# Patient Record
Sex: Male | Born: 1980 | Race: White | Hispanic: No | Smoking: Never smoker
Health system: Southern US, Community
[De-identification: ages and names within clinical notes are randomized; demographics above are authoritative.]

## PROBLEM LIST (undated history)

## (undated) DIAGNOSIS — Z789 Other specified health status: Secondary | ICD-10-CM

## (undated) HISTORY — DX: Other specified health status: Z78.9

## (undated) HISTORY — PX: NO PAST SURGERIES: SHX2092

---

## 2009-12-09 ENCOUNTER — Ambulatory Visit (HOSPITAL_COMMUNITY): Admission: RE | Admit: 2009-12-09 | Discharge: 2009-12-09 | Payer: Self-pay | Admitting: Dermatology

## 2011-03-03 IMAGING — CR DG CHEST 2V
3 series · 3 of 3 positions shown · non-contrast
Comparison: None

CLINICAL DATA: Rash.

CHEST - 2 VIEW

[w chest pa]
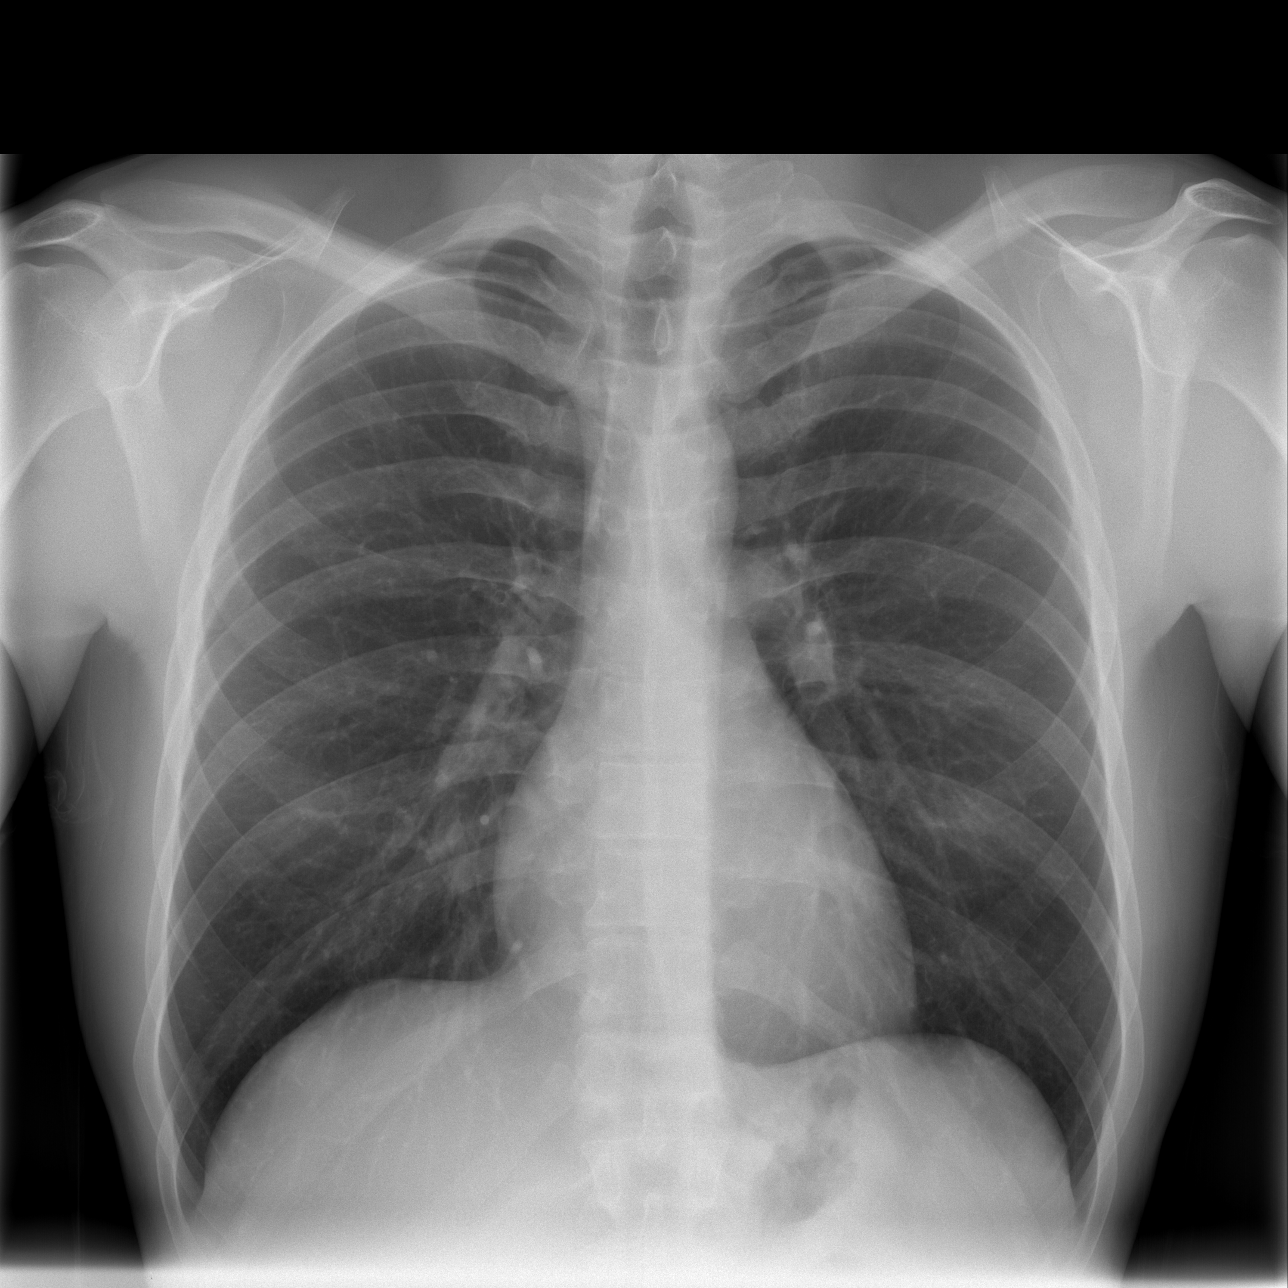

[w chest lat (1 of 2)]
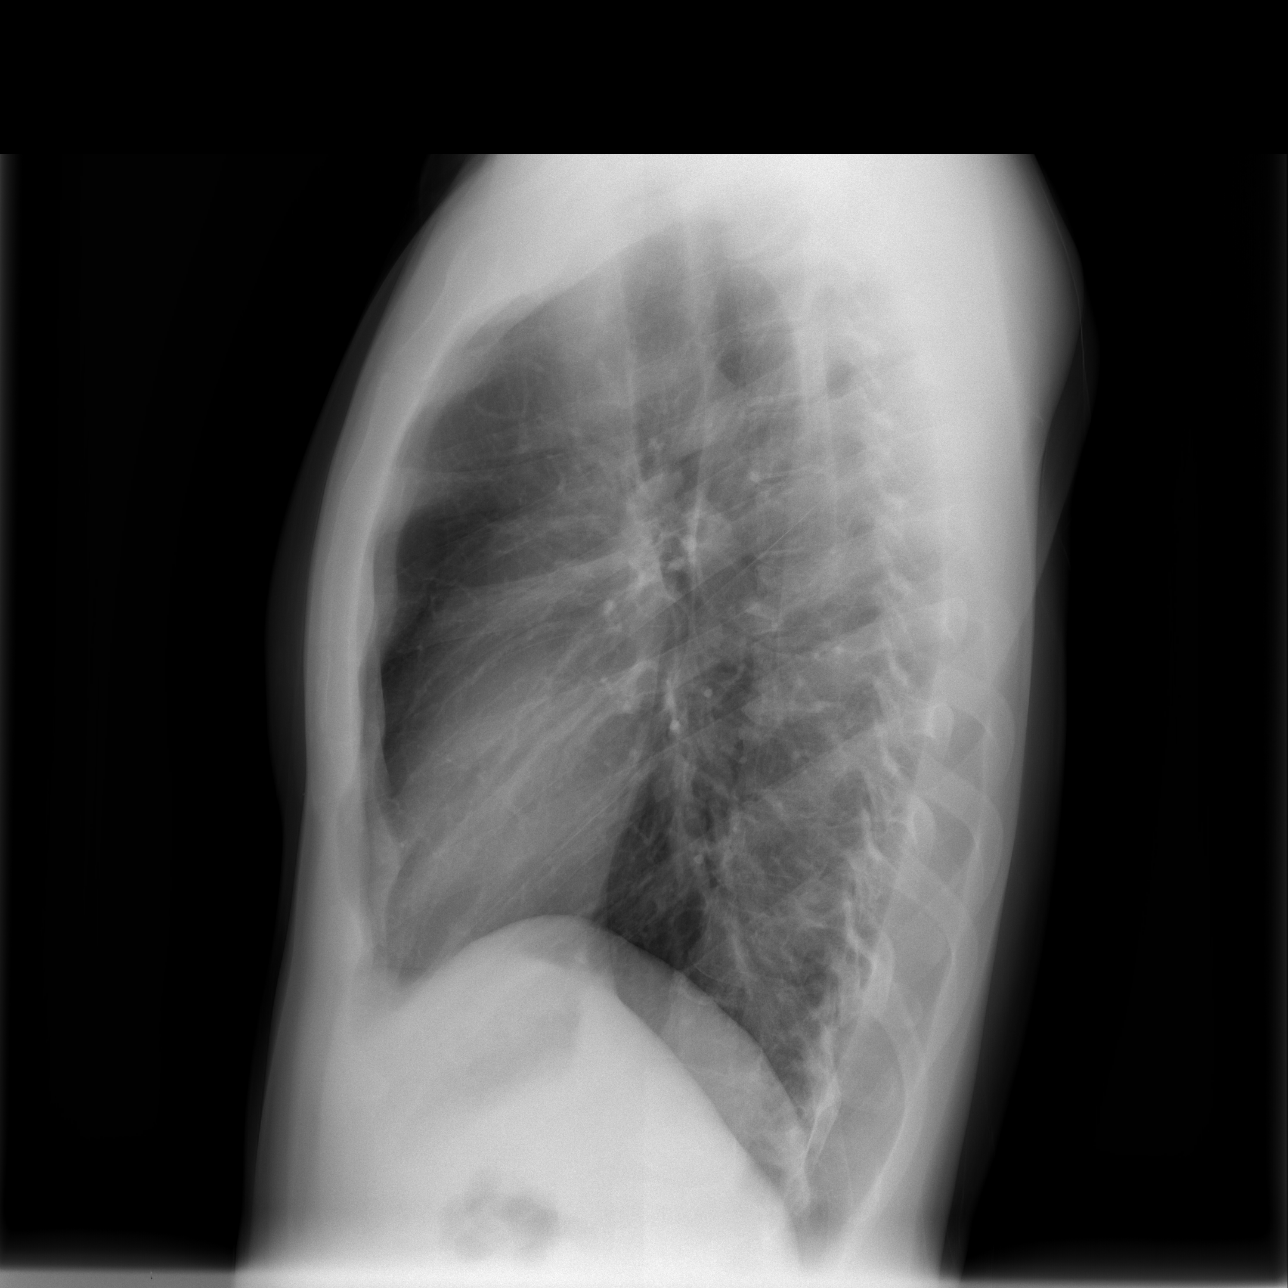

[w chest lat (2 of 2)]
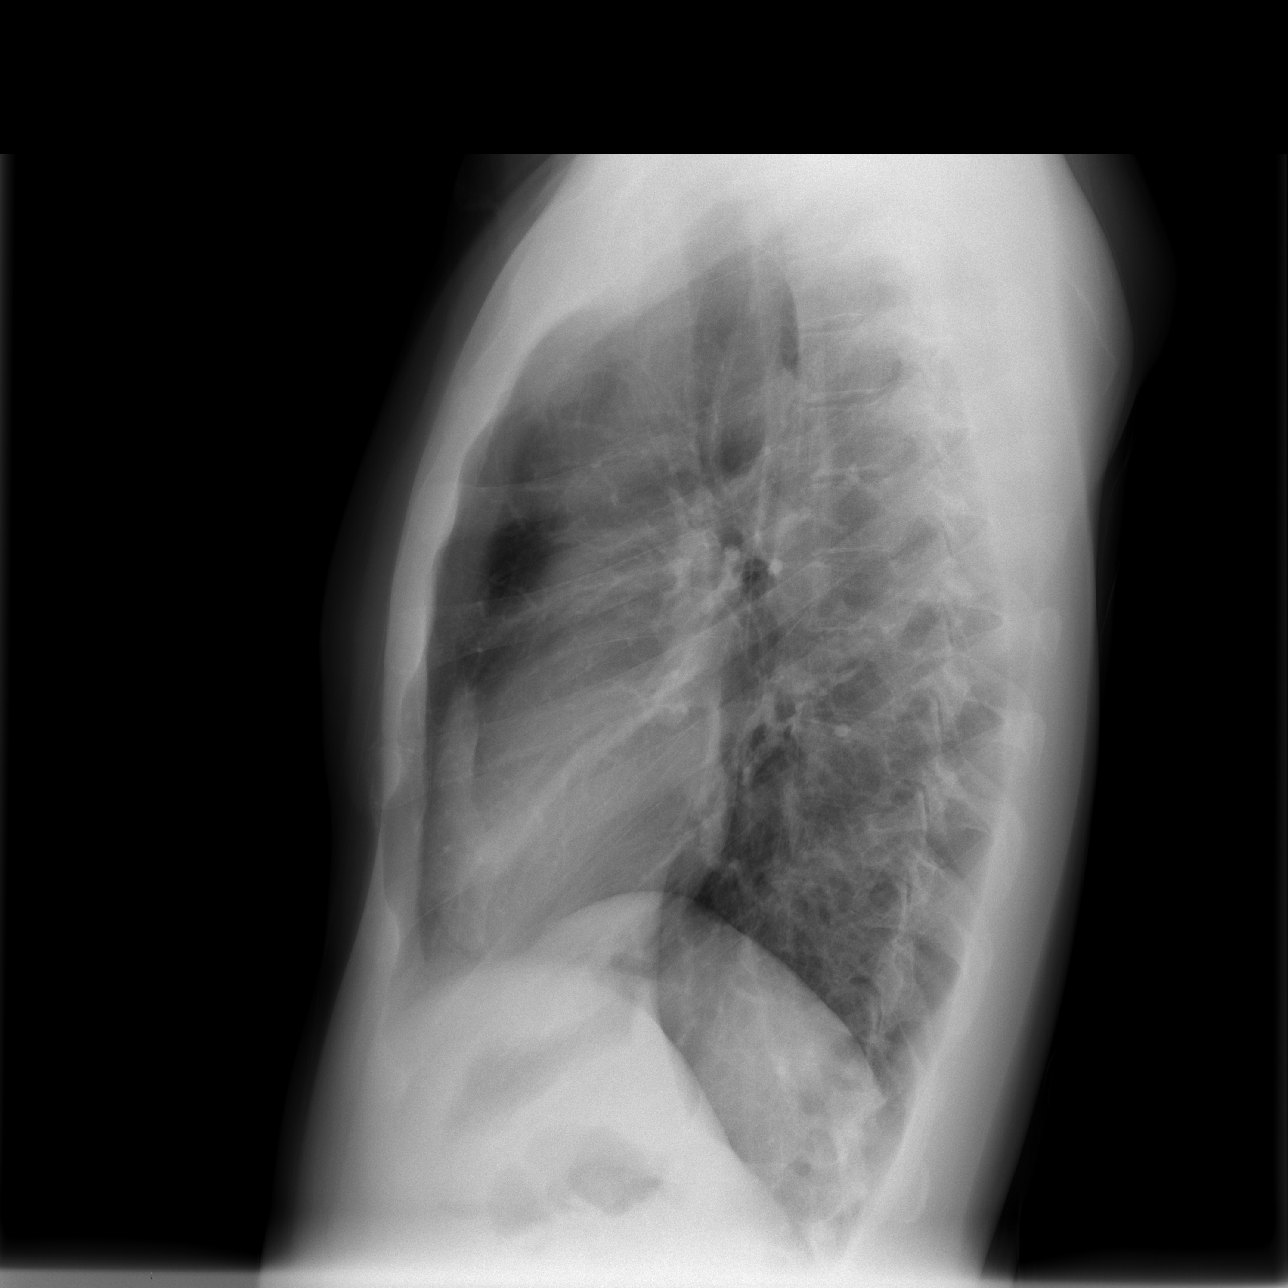

[3 of 3 positions shown; findings below may reference images not displayed]

FINDINGS: The cardiac silhouette, mediastinal and hilar contours
are within normal limits.  The lungs are clear.  The bony thorax is
intact.
IMPRESSION: Normal chest x-ray.

## 2016-12-01 ENCOUNTER — Ambulatory Visit (INDEPENDENT_AMBULATORY_CARE_PROVIDER_SITE_OTHER): Payer: Managed Care, Other (non HMO) | Admitting: Family Medicine

## 2016-12-01 ENCOUNTER — Encounter: Payer: Self-pay | Admitting: Family Medicine

## 2016-12-01 VITALS — BP 116/71 | HR 91 | Temp 98.8°F | Ht 69.0 in | Wt 196.0 lb

## 2016-12-01 DIAGNOSIS — J111 Influenza due to unidentified influenza virus with other respiratory manifestations: Secondary | ICD-10-CM

## 2016-12-01 MED ORDER — OSELTAMIVIR PHOSPHATE 75 MG PO CAPS: 75.0000 mg | ORAL_CAPSULE | Freq: Two times a day (BID) | ORAL | 0 refills | Status: DC

## 2016-12-01 NOTE — Progress Notes (Signed)
   Subjective:  Patient ID: Phylis BougieJeremy Baley, male    DOB: 1981/04/05  Age: 36 y.o. MRN: [id number]  CC: New Patient (Initial Visit) (pt here today to establish care, and he is also c/o dizziness, felt terrible, body aches, congestion and chills.)   HPI Phylis BougieJeremy Chub[place] presents for  Patient presents with dry cough runny stuffy nose. Diffuse headache of moderate intensity. Patient also has chills and subjective fever. Body aches worst in the back but present in the legs, shoulders, and torso as well.Sx wax & wane. Onset 5 days ago.   History Riki RuskJeremy has no past medical history on file.   He has no past surgical history on file.   His family history includes Cancer in his mother; Stroke in his maternal grandmother.He reports that he has never smoked. He has never used smokeless tobacco. He reports that he does not drink alcohol or use drugs.  No current outpatient prescriptions on file prior to visit.   No current facility-administered medications on file prior to visit.     ROS Review of Systems  Constitutional: Positive for activity change, chills, diaphoresis and fever. Negative for appetite change.  HENT: Negative for congestion, ear discharge, ear pain, hearing loss, nosebleeds, postnasal drip, rhinorrhea, sinus pressure, sneezing, sore throat and trouble swallowing.   Respiratory: Positive for cough. Negative for chest tightness and shortness of breath.   Cardiovascular: Negative for chest pain and palpitations.  Gastrointestinal: Negative for abdominal pain.  Musculoskeletal: Negative for arthralgias and myalgias.  Skin: Negative for color change and rash.  Neurological: Negative for weakness and headaches.    Objective:  BP 116/71   Pulse 91   Temp 98.8 F (37.1 C) (Oral)   Ht 5\' 9"  (1.753 m)   Wt 196 lb (88.9 kg)   BMI 28.94 kg/m   Physical Exam  Assessment & Plan:   Riki RuskJeremy was seen today for new patient (initial visit).  Diagnoses and all orders for this  visit:  Influenza with respiratory manifestation  Other orders -     oseltamivir (TAMIFLU) 75 MG capsule; Take 1 capsule (75 mg total) by mouth 2 (two) times daily.   I am having Mr. Neva SeatGreene start on oseltamivir. I am also having him maintain his Phenylephrine-Pheniramine-DM.  Meds ordered this encounter  Medications  . Phenylephrine-Pheniramine-DM (THERAFLU COLD & COUGH) 08-14-19 MG PACK    Sig: Take by mouth.  . oseltamivir (TAMIFLU) 75 MG capsule    Sig: Take 1 capsule (75 mg total) by mouth 2 (two) times daily.    Dispense:  10 capsule    Refill:  0     Follow-up: No Follow-up on file.  Mechele ClaudeWarren [place], M.D.

## 2022-09-27 ENCOUNTER — Ambulatory Visit (INDEPENDENT_AMBULATORY_CARE_PROVIDER_SITE_OTHER): Payer: BC Managed Care – PPO | Admitting: Family Medicine

## 2022-09-27 ENCOUNTER — Encounter: Payer: Self-pay | Admitting: Family Medicine

## 2022-09-27 VITALS — BP 121/78 | HR 75 | Temp 98.0°F | Ht 69.0 in | Wt 215.0 lb

## 2022-09-27 DIAGNOSIS — Z1283 Encounter for screening for malignant neoplasm of skin: Secondary | ICD-10-CM | POA: Diagnosis not present

## 2022-09-27 DIAGNOSIS — Z0000 Encounter for general adult medical examination without abnormal findings: Secondary | ICD-10-CM

## 2022-09-27 DIAGNOSIS — Z1159 Encounter for screening for other viral diseases: Secondary | ICD-10-CM | POA: Diagnosis not present

## 2022-09-27 DIAGNOSIS — Z23 Encounter for immunization: Secondary | ICD-10-CM

## 2022-09-27 NOTE — Patient Instructions (Signed)
Give us 2-3 business days to get the results of your labs back.   Keep the diet clean and stay active.  Please get me a copy of your advanced directive form at your convenience.   Let us know if you need anything.  

## 2022-09-27 NOTE — Addendum Note (Signed)
Addended by: Scharlene Gloss B on: 09/27/2022 02:37 PM   Modules accepted: Orders

## 2022-09-27 NOTE — Progress Notes (Signed)
Chief Complaint  Patient presents with   New Patient (Initial Visit)    Well Male Dave Lewis is here for a complete physical.   His last physical was >1 year ago.  Current diet: in general, diet could be better.   Current exercise: none currently; cardio/weight lifting Weight trend: stable Fatigue out of ordinary? No. Seat belt? Yes.   Advanced directive? No  Health maintenance Tetanus- Due HIV- Yes Hep C- No  Past Medical History:  Diagnosis Date   No known health problems      Past Surgical History:  Procedure Laterality Date   NO PAST SURGERIES     Medications  Takes no meds routinely.    Allergies Allergies  Allergen Reactions   Tamiflu [Oseltamivir Phosphate] Nausea Only    Family History Family History  Problem Relation Age of Onset   Cancer Mother        skin cancer   High blood pressure Father    Stroke Maternal Grandmother    Heart disease Neg Hx    Prostate cancer Neg Hx    Colon cancer Neg Hx     Review of Systems: Constitutional: no fevers or chills Eye:  no recent significant change in vision Ear/Nose/Mouth/Throat:  Ears:  no hearing loss Nose/Mouth/Throat:  no complaints of nasal congestion, no sore throat Cardiovascular:  no chest pain Respiratory:  no shortness of breath Gastrointestinal:  no abdominal pain, no change in bowel habits GU:  Male: negative for dysuria, frequency, and incontinence Musculoskeletal/Extremities:  no pain of the joints Integumentary (Skin/Breast):  no abnormal skin lesions reported Neurologic:  no headaches Endocrine: No unexpected weight changes Hematologic/Lymphatic:  no night sweats  Exam BP 121/78 (BP Location: Left Arm, Patient Position: Sitting, Cuff Size: Normal)   Pulse 75   Temp 98 F (36.7 C) (Oral)   Ht 5\' 9"  (1.753 m)   Wt 215 lb (97.5 kg)   SpO2 98%   BMI 31.75 kg/m  General:  well developed, well nourished, in no apparent distress Skin:  no significant moles, warts, or growths Head:   no masses, lesions, or tenderness Eyes:  pupils equal and round, sclera anicteric without injection Ears:  canals without lesions, TMs shiny without retraction, no obvious effusion, no erythema Nose:  nares patent, mucosa normal Throat/Pharynx:  lips and gingiva without lesion; tongue and uvula midline; non-inflamed pharynx; no exudates or postnasal drainage Neck: neck supple without adenopathy, thyromegaly, or masses Lungs:  clear to auscultation, breath sounds equal bilaterally, no respiratory distress Cardio:  regular rate and rhythm, no bruits, no LE edema Abdomen:  abdomen soft, nontender; bowel sounds normal; no masses or organomegaly Rectal: Deferred Musculoskeletal:  symmetrical muscle groups noted without atrophy or deformity Extremities:  no clubbing, cyanosis, or edema, no deformities, no skin discoloration Neuro:  gait normal; deep tendon reflexes normal and symmetric Psych: well oriented with normal range of affect and appropriate judgment/insight  Assessment and Plan  Well adult exam - Plan: CBC, Comprehensive metabolic panel, Lipid panel  Skin cancer screening - Plan: Ambulatory referral to Dermatology  Encounter for hepatitis C screening test for low risk patient - Plan: Hepatitis C antibody   Well 41 y.o. male. Counseled on diet and exercise. Counseled on risks and benefits of prostate cancer screening with PSA. The patient agrees to forego screening.  Advanced directive form provided today.  Tdap today.  Other orders as above. Follow up in 1 yr pending the above workup. The patient voiced understanding and agreement to the  plan.  Sharlene Dory, DO 09/27/22 2:19 PM

## 2022-09-28 LAB — LIPID PANEL
Cholesterol: 175 mg/dL (ref 0–200)
HDL: 43.2 mg/dL (ref >39.00)
LDL Cholesterol: 112 mg/dL — ABNORMAL HIGH (ref 0–99)
NonHDL: 131.63
Total CHOL/HDL Ratio: 4
Triglycerides: 100 mg/dL (ref 0.0–149.0)
VLDL: 20 mg/dL (ref 0.0–40.0)

## 2022-09-28 LAB — COMPREHENSIVE METABOLIC PANEL
ALT: 44 U/L (ref 0–53)
AST: 25 U/L (ref 0–37)
Albumin: 4.8 g/dL (ref 3.5–5.2)
Alkaline Phosphatase: 71 U/L (ref 39–117)
BUN: 14 mg/dL (ref 6–23)
CO2: 28 mEq/L (ref 19–32)
Calcium: 9.5 mg/dL (ref 8.4–10.5)
Chloride: 105 mEq/L (ref 96–112)
Creatinine, Ser: 1.02 mg/dL (ref 0.40–1.50)
GFR: 91.59 mL/min (ref >60.00)
Glucose, Bld: 92 mg/dL (ref 70–99)
Potassium: 4 mEq/L (ref 3.5–5.1)
Sodium: 140 mEq/L (ref 135–145)
Total Bilirubin: 1.2 mg/dL (ref 0.2–1.2)
Total Protein: 7.3 g/dL (ref 6.0–8.3)

## 2022-09-28 LAB — CBC
HCT: 46.9 % (ref 39.0–52.0)
Hemoglobin: 16.6 g/dL (ref 13.0–17.0)
MCHC: 35.4 g/dL (ref 30.0–36.0)
MCV: 86.2 fl (ref 78.0–100.0)
Platelets: 193 10*3/uL (ref 150.0–400.0)
RBC: 5.44 Mil/uL (ref 4.22–5.81)
RDW: 13 % (ref 11.5–15.5)
WBC: 7.5 10*3/uL (ref 4.0–10.5)

## 2022-09-28 LAB — HEPATITIS C ANTIBODY: Hepatitis C Ab: NONREACTIVE

## 2022-11-04 DIAGNOSIS — L281 Prurigo nodularis: Secondary | ICD-10-CM | POA: Diagnosis not present

## 2022-11-04 DIAGNOSIS — K141 Geographic tongue: Secondary | ICD-10-CM | POA: Diagnosis not present

## 2022-11-04 DIAGNOSIS — D225 Melanocytic nevi of trunk: Secondary | ICD-10-CM | POA: Diagnosis not present

## 2023-09-30 ENCOUNTER — Ambulatory Visit (INDEPENDENT_AMBULATORY_CARE_PROVIDER_SITE_OTHER): Payer: BC Managed Care – PPO | Admitting: Family Medicine

## 2023-09-30 ENCOUNTER — Encounter: Payer: Self-pay | Admitting: Family Medicine

## 2023-09-30 VITALS — BP 122/78 | HR 69 | Temp 98.0°F | Resp 16 | Ht 69.0 in | Wt 214.2 lb

## 2023-09-30 DIAGNOSIS — Z0000 Encounter for general adult medical examination without abnormal findings: Secondary | ICD-10-CM | POA: Diagnosis not present

## 2023-09-30 LAB — CBC
HCT: 48.6 % (ref 39.0–52.0)
Hemoglobin: 16.7 g/dL (ref 13.0–17.0)
MCHC: 34.4 g/dL (ref 30.0–36.0)
MCV: 89.5 fL (ref 78.0–100.0)
Platelets: 186 10*3/uL (ref 150.0–400.0)
RBC: 5.44 Mil/uL (ref 4.22–5.81)
RDW: 13 % (ref 11.5–15.5)
WBC: 8.2 10*3/uL (ref 4.0–10.5)

## 2023-09-30 LAB — LIPID PANEL
Cholesterol: 181 mg/dL (ref 0–200)
HDL: 37.3 mg/dL — ABNORMAL LOW (ref >39.00)
LDL Cholesterol: 124 mg/dL — ABNORMAL HIGH (ref 0–99)
NonHDL: 143.5
Total CHOL/HDL Ratio: 5
Triglycerides: 99 mg/dL (ref 0.0–149.0)
VLDL: 19.8 mg/dL (ref 0.0–40.0)

## 2023-09-30 LAB — COMPREHENSIVE METABOLIC PANEL
ALT: 43 U/L (ref 0–53)
AST: 25 U/L (ref 0–37)
Albumin: 4.8 g/dL (ref 3.5–5.2)
Alkaline Phosphatase: 73 U/L (ref 39–117)
BUN: 16 mg/dL (ref 6–23)
CO2: 29 meq/L (ref 19–32)
Calcium: 9.4 mg/dL (ref 8.4–10.5)
Chloride: 103 meq/L (ref 96–112)
Creatinine, Ser: 1.03 mg/dL (ref 0.40–1.50)
GFR: 89.89 mL/min (ref >60.00)
Glucose, Bld: 75 mg/dL (ref 70–99)
Potassium: 4.3 meq/L (ref 3.5–5.1)
Sodium: 139 meq/L (ref 135–145)
Total Bilirubin: 1.6 mg/dL — ABNORMAL HIGH (ref 0.2–1.2)
Total Protein: 7.6 g/dL (ref 6.0–8.3)

## 2023-09-30 NOTE — Patient Instructions (Signed)
Give us 2-3 business days to get the results of your labs back.   Keep the diet clean and stay active.  Please get me a copy of your advanced directive form at your convenience.   Let us know if you need anything.  

## 2023-09-30 NOTE — Progress Notes (Signed)
Chief Complaint  Patient presents with   Annual Exam    Annual Exam    Well Male [place] Dave Lewis is here for a complete physical.   His last physical was >1 year ago.  Current diet: in general, a "healthy" diet.   Current exercise: some hiking Weight trend: stable Fatigue out of ordinary? No. Seat belt? Yes.   Advanced directive? Yes  Health maintenance Tetanus- Yes HIV- Yes Hep C- Yes  Past Medical History:  Diagnosis Date   No known health problems      Past Surgical History:  Procedure Laterality Date   NO PAST SURGERIES     Medications  Takes no meds routinely.    Allergies Allergies  Allergen Reactions   Tamiflu [Oseltamivir Phosphate] Nausea Only    Family History Family History  Problem Relation Age of Onset   Cancer Mother        skin cancer   High blood pressure Father    Stroke Maternal Grandmother    Heart disease Neg Hx    Prostate cancer Neg Hx    Colon cancer Neg Hx     Review of Systems: Constitutional: no fevers or chills Eye:  no recent significant change in vision Ear/Nose/Mouth/Throat:  Ears:  no hearing loss Nose/Mouth/Throat:  no complaints of nasal congestion, no sore throat Cardiovascular:  no chest pain Respiratory:  no shortness of breath Gastrointestinal:  no abdominal pain, no change in bowel habits GU:  Male: negative for dysuria, frequency, and incontinence Musculoskeletal/Extremities:  no pain of the joints Integumentary (Skin/Breast):  no abnormal skin lesions reported Neurologic:  no headaches Endocrine: No unexpected weight changes Hematologic/Lymphatic:  no night sweats  Exam BP 122/78 (BP Location: Left Arm, Patient Position: Sitting, Cuff Size: Normal)   Pulse 69   Temp 98 F (36.7 C) (Oral)   Resp 16   Ht 5\' 9"  (1.753 m)   Wt 214 lb 3.2 oz (97.2 kg)   SpO2 99%   BMI 31.63 kg/m  General:  well developed, well nourished, in no apparent distress Skin:  no significant moles, warts, or growths Head:  no  masses, lesions, or tenderness Eyes:  pupils equal and round, sclera anicteric without injection Ears:  canals without lesions, TMs shiny without retraction, no obvious effusion, no erythema Nose:  nares patent, mucosa normal Throat/Pharynx:  lips and gingiva without lesion; tongue and uvula midline; non-inflamed pharynx; no exudates or postnasal drainage Neck: neck supple without adenopathy, thyromegaly, or masses Lungs:  clear to auscultation, breath sounds equal bilaterally, no respiratory distress Cardio:  regular rate and rhythm, no bruits, no LE edema Abdomen:  abdomen soft, nontender; bowel sounds normal; no masses or organomegaly Rectal: Deferred Musculoskeletal:  symmetrical muscle groups noted without atrophy or deformity Extremities:  no clubbing, cyanosis, or edema, no deformities, no skin discoloration Neuro:  gait normal; deep tendon reflexes normal and symmetric Psych: well oriented with normal range of affect and appropriate judgment/insight  Assessment and Plan  Well adult exam - Plan: CBC, Comprehensive metabolic panel, Lipid panel   Well 42 y.o. male. Counseled on diet and exercise. Counseled on risks and benefits of prostate cancer screening with PSA. The patient agrees to forego screening.  Advanced directive form requested today.  Other orders as above. Follow up in 1 yr pending the above workup. The patient voiced understanding and agreement to the plan.  Jilda Roche [place], DO 09/30/23 12:49 PM

## 2024-10-01 ENCOUNTER — Encounter: Payer: Self-pay | Admitting: Family Medicine

## 2024-10-01 ENCOUNTER — Ambulatory Visit: Payer: Self-pay | Admitting: Family Medicine

## 2024-10-01 ENCOUNTER — Ambulatory Visit (INDEPENDENT_AMBULATORY_CARE_PROVIDER_SITE_OTHER): Payer: BC Managed Care – PPO | Admitting: Family Medicine

## 2024-10-01 VITALS — BP 126/76 | HR 100 | Temp 98.0°F | Resp 16 | Ht 69.0 in | Wt 220.0 lb

## 2024-10-01 DIAGNOSIS — Z0000 Encounter for general adult medical examination without abnormal findings: Secondary | ICD-10-CM

## 2024-10-01 LAB — CBC
HCT: 45 % (ref 39.0–52.0)
Hemoglobin: 15.9 g/dL (ref 13.0–17.0)
MCHC: 35.2 g/dL (ref 30.0–36.0)
MCV: 86.5 fl (ref 78.0–100.0)
Platelets: 180 K/uL (ref 150.0–400.0)
RBC: 5.2 Mil/uL (ref 4.22–5.81)
RDW: 13.5 % (ref 11.5–15.5)
WBC: 6.8 K/uL (ref 4.0–10.5)

## 2024-10-01 LAB — COMPREHENSIVE METABOLIC PANEL WITH GFR
ALT: 44 U/L (ref 0–53)
AST: 28 U/L (ref 0–37)
Albumin: 4.8 g/dL (ref 3.5–5.2)
Alkaline Phosphatase: 70 U/L (ref 39–117)
BUN: 15 mg/dL (ref 6–23)
CO2: 29 meq/L (ref 19–32)
Calcium: 9.4 mg/dL (ref 8.4–10.5)
Chloride: 104 meq/L (ref 96–112)
Creatinine, Ser: 1.01 mg/dL (ref 0.40–1.50)
GFR: 91.38 mL/min (ref >60.00)
Glucose, Bld: 90 mg/dL (ref 70–99)
Potassium: 4.2 meq/L (ref 3.5–5.1)
Sodium: 140 meq/L (ref 135–145)
Total Bilirubin: 1.4 mg/dL — ABNORMAL HIGH (ref 0.2–1.2)
Total Protein: 7.2 g/dL (ref 6.0–8.3)

## 2024-10-01 LAB — LIPID PANEL
Cholesterol: 166 mg/dL (ref 0–200)
HDL: 38.2 mg/dL — ABNORMAL LOW (ref >39.00)
LDL Cholesterol: 109 mg/dL — ABNORMAL HIGH (ref 0–99)
NonHDL: 127.64
Total CHOL/HDL Ratio: 4
Triglycerides: 92 mg/dL (ref 0.0–149.0)
VLDL: 18.4 mg/dL (ref 0.0–40.0)

## 2024-10-01 NOTE — Patient Instructions (Signed)
 Give [place] 2-3 business days to get the results of your labs back.   Keep the diet clean and stay active.  Please get me a copy of your advanced directive form at your convenience.   Let us know if you need anything.

## 2024-10-01 NOTE — Progress Notes (Signed)
 Chief Complaint  Patient presents with   Annual Exam    CPE    Well Male Dave Lewis is here for a complete physical.   His last physical was >1 year ago.  Current diet: in general, diet could  be better.    Current exercise: nothing consistent Weight trend: stable Fatigue out of ordinary? No. Seat belt? Yes.   Advanced directive? No  Health maintenance Tetanus- Yes HIV- Yes Hep C- Yes  Past Medical History:  Diagnosis Date   No known health problems      Past Surgical History:  Procedure Laterality Date   NO PAST SURGERIES      Medications  Takes no meds routinely.  Allergies Allergies  Allergen Reactions   Tamiflu  [Oseltamivir  Phosphate] Nausea Only    Family History Family History  Problem Relation Age of Onset   Cancer Mother        skin cancer   High blood pressure Father    Prostate cancer Father 58   Stroke Maternal Grandmother    Heart disease Neg Hx    Colon cancer Neg Hx     Review of Systems: Constitutional: no fevers or chills Eye:  no recent significant change in vision Ear/Nose/Mouth/Throat:  Ears:  no hearing loss Nose/Mouth/Throat:  no complaints of nasal congestion, no sore throat Cardiovascular:  no chest pain Respiratory:  no shortness of breath Gastrointestinal:  no abdominal pain, no change in bowel habits GU:  Male: negative for dysuria, frequency, and incontinence Musculoskeletal/Extremities:  no pain of the joints Integumentary (Skin/Breast):  no abnormal skin lesions reported Neurologic:  no headaches Endocrine: No unexpected weight changes Hematologic/Lymphatic:  no night sweats  Exam BP 126/76 (BP Location: Left Arm, Patient Position: Sitting)   Pulse 100   Temp 98 F (36.7 C) (Oral)   Resp 16   Ht 5' 9 (1.753 m)   Wt 220 lb (99.8 kg)   SpO2 96%   BMI 32.49 kg/m  General:  well developed, well nourished, in no apparent distress Skin:  no significant moles, warts, or growths Head:  no masses, lesions, or  tenderness Eyes:  pupils equal and round, sclera anicteric without injection Ears:  canals without lesions, TMs shiny without retraction, no obvious effusion, no erythema Nose:  nares patent, mucosa normal Throat/Pharynx:  lips and gingiva without lesion; tongue and uvula midline; non-inflamed pharynx; no exudates or postnasal drainage Neck: neck supple without adenopathy, thyromegaly, or masses Lungs:  clear to auscultation, breath sounds equal bilaterally, no respiratory distress Cardio:  regular rate and rhythm, no bruits, no LE edema Abdomen:  abdomen soft, nontender; bowel sounds normal; no masses or organomegaly Rectal: Deferred Musculoskeletal:  symmetrical muscle groups noted without atrophy or deformity Extremities:  no clubbing, cyanosis, or edema, no deformities, no skin discoloration Neuro:  gait normal; deep tendon reflexes normal and symmetric Psych: well oriented with normal range of affect and appropriate judgment/insight  Assessment and Plan  Well adult exam - Plan: CBC, Comprehensive metabolic panel with GFR, Lipid panel, Hepatitis B surface antibody,quantitative   Well 43 y.o. male. Counseled on diet and exercise. Counseled on risks and benefits of prostate cancer screening with PSA. The patient agrees to forego screening.  Advanced directive form requested today.  Screen Hep B. Other orders as above. Follow up in 1 yr pending the above workup. The patient voiced understanding and agreement to the plan.  Mabel [place], DO 10/01/24 10:08 AM

## 2024-10-02 LAB — HEPATITIS B SURFACE ANTIBODY, QUANTITATIVE: Hep B S AB Quant (Post): <5 m[IU]/mL — ABNORMAL LOW (ref >=10)

## 2024-10-09 ENCOUNTER — Ambulatory Visit

## 2024-10-09 DIAGNOSIS — Z23 Encounter for immunization: Secondary | ICD-10-CM

## 2024-10-09 NOTE — Progress Notes (Signed)
 Pt here for Hep B vaccine per Dr. Frann: Please sched pt for 2 shot Hep B vaccination series 1 month apart.   He received vaccine in left deltoid and tolerated vaccines well.  2nd Hep B is scheduled on 11/13/24 @ 10:00 AM.

## 2024-10-29 ENCOUNTER — Telehealth: Payer: Self-pay | Admitting: *Deleted

## 2024-10-29 NOTE — Telephone Encounter (Signed)
 That is fine.   Thank you

## 2024-10-29 NOTE — Telephone Encounter (Signed)
 Sent pt message it was ok to have both Hep B and HPV on the same day.

## 2024-10-29 NOTE — Telephone Encounter (Signed)
 Copied from CRM [id number]. Topic: Clinical - Request for Lab/Test Order >> Oct 29, 2024  8:56 AM Deaijah H wrote: Reason for CRM: Patient called in to get scheduled for HPV vaccine, but an order is needed & would like to know if he can receive Hep B and HPV on the same day (11/13/24).

## 2024-11-13 ENCOUNTER — Ambulatory Visit (INDEPENDENT_AMBULATORY_CARE_PROVIDER_SITE_OTHER)

## 2024-11-13 DIAGNOSIS — Z23 Encounter for immunization: Secondary | ICD-10-CM

## 2024-11-13 NOTE — Progress Notes (Signed)
 Pt here for Hep B and HPV vaccines per Dr. Frann: Please sched pt for 2 shot Hep B vaccination series 1 month apart.   He received vaccines in left/ right deltoid and tolerated vaccines well.  2nd HPV scheduled 01/11/2025

## 2025-01-11 ENCOUNTER — Ambulatory Visit

## 2025-10-02 ENCOUNTER — Encounter: Admitting: Family Medicine
